# Patient Record
Sex: Male | Born: 2000 | Race: White | Hispanic: No | Marital: Single | Smoking: Never smoker
Health system: Southern US, Community
[De-identification: ages and names within clinical notes are randomized; demographics above are authoritative.]

## PROBLEM LIST (undated history)

## (undated) DIAGNOSIS — F909 Attention-deficit hyperactivity disorder, unspecified type: Secondary | ICD-10-CM

## (undated) DIAGNOSIS — M25562 Pain in left knee: Secondary | ICD-10-CM

## (undated) HISTORY — PX: HERNIA REPAIR: SHX51

## (undated) HISTORY — PX: WRIST MASS EXCISION: SHX2674

---
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
## 2020-08-14 ENCOUNTER — Emergency Department (HOSPITAL_BASED_OUTPATIENT_CLINIC_OR_DEPARTMENT_OTHER)
Admission: EM | Admit: 2020-08-14 | Discharge: 2020-08-14 | Disposition: A | Discharge status: Home / Self Care | Payer: 59 | Attending: Emergency Medicine | Admitting: Emergency Medicine

## 2020-08-14 ENCOUNTER — Encounter (HOSPITAL_BASED_OUTPATIENT_CLINIC_OR_DEPARTMENT_OTHER): Payer: Self-pay | Admitting: *Deleted

## 2020-08-14 ENCOUNTER — Other Ambulatory Visit: Payer: Self-pay

## 2020-08-14 ENCOUNTER — Emergency Department (HOSPITAL_BASED_OUTPATIENT_CLINIC_OR_DEPARTMENT_OTHER): Payer: 59

## 2020-08-14 DIAGNOSIS — X58XXXA Exposure to other specified factors, initial encounter: Secondary | ICD-10-CM | POA: Diagnosis not present

## 2020-08-14 DIAGNOSIS — S99911A Unspecified injury of right ankle, initial encounter: Secondary | ICD-10-CM | POA: Insufficient documentation

## 2020-08-14 DIAGNOSIS — Y999 Unspecified external cause status: Secondary | ICD-10-CM | POA: Diagnosis not present

## 2020-08-14 DIAGNOSIS — Z5321 Procedure and treatment not carried out due to patient leaving prior to being seen by health care provider: Secondary | ICD-10-CM | POA: Insufficient documentation

## 2020-08-14 DIAGNOSIS — Y929 Unspecified place or not applicable: Secondary | ICD-10-CM | POA: Diagnosis not present

## 2020-08-14 DIAGNOSIS — Y939 Activity, unspecified: Secondary | ICD-10-CM | POA: Insufficient documentation

## 2020-08-14 NOTE — ED Triage Notes (Signed)
C/O RIGHT ANKLE INJURY X 1 WEEK AGO

## 2020-08-14 NOTE — ED Notes (Signed)
Patient transported to X-ray 

## 2020-08-14 NOTE — ED Notes (Signed)
Pt's mother sts they no longer want to wait and are leaving

## 2022-09-12 ENCOUNTER — Encounter

## 2022-09-12 NOTE — Progress Notes (Signed)
Left medial knee pain and swelling after getting hit in back of left lower leg when running with the football. Felt pain and pop in left knee followed by swelling. Hx of ACL reconstruction s/p MCL, ACL tears and medial meniscus tears. Exam concerning for MCL sprain. Will get MRI to eval.

## 2022-09-21 ENCOUNTER — Inpatient Hospital Stay
Admit: 2022-09-21 | Payer: PRIVATE HEALTH INSURANCE | Attending: Student in an Organized Health Care Education/Training Program

## 2022-09-21 DIAGNOSIS — M25562 Pain in left knee: Secondary | ICD-10-CM
